# Patient Record
Sex: Female | Born: 2000 | Hispanic: No | Marital: Single | Smoking: Never smoker
Health system: Southern US, Community
[De-identification: ages and names within clinical notes are randomized; demographics above are authoritative.]

## PROBLEM LIST (undated history)

## (undated) ENCOUNTER — Inpatient Hospital Stay (HOSPITAL_COMMUNITY): Payer: Self-pay

## (undated) DIAGNOSIS — F321 Major depressive disorder, single episode, moderate: Secondary | ICD-10-CM

## (undated) DIAGNOSIS — J45909 Unspecified asthma, uncomplicated: Secondary | ICD-10-CM

## (undated) HISTORY — PX: WISDOM TOOTH EXTRACTION: SHX21

---
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
## 2022-05-27 ENCOUNTER — Encounter (HOSPITAL_COMMUNITY): Payer: Self-pay | Admitting: *Deleted

## 2022-05-27 ENCOUNTER — Ambulatory Visit (HOSPITAL_COMMUNITY)
Admission: EM | Admit: 2022-05-27 | Discharge: 2022-05-27 | Disposition: A | Discharge status: Home / Self Care | Payer: Medicaid Other | Attending: Family Medicine | Admitting: Family Medicine

## 2022-05-27 DIAGNOSIS — L03113 Cellulitis of right upper limb: Secondary | ICD-10-CM

## 2022-05-27 DIAGNOSIS — S50861A Insect bite (nonvenomous) of right forearm, initial encounter: Secondary | ICD-10-CM

## 2022-05-27 DIAGNOSIS — W57XXXA Bitten or stung by nonvenomous insect and other nonvenomous arthropods, initial encounter: Secondary | ICD-10-CM

## 2022-05-27 MED ORDER — DOXYCYCLINE HYCLATE 100 MG PO CAPS: 100.0000 mg | ORAL_CAPSULE | Freq: Two times a day (BID) | ORAL | 0 refills | Status: DC

## 2022-05-27 MED ORDER — PREDNISONE 20 MG PO TABS: 40.0000 mg | ORAL_TABLET | Freq: Every day | ORAL | 0 refills | Status: DC

## 2022-05-27 NOTE — ED Triage Notes (Signed)
Pt states she went on vacation to [place] she now she has bites all over her head and right hand. She complains they are itchy. She took a benadryl last night and today.
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
## 2022-07-17 ENCOUNTER — Other Ambulatory Visit: Payer: Self-pay

## 2022-07-17 ENCOUNTER — Encounter (HOSPITAL_COMMUNITY): Payer: Self-pay

## 2022-07-17 ENCOUNTER — Emergency Department (HOSPITAL_COMMUNITY)
Admission: EM | Admit: 2022-07-17 | Discharge: 2022-07-17 | Discharge status: Against Medical Advice | Payer: Medicaid Other | Attending: Student | Admitting: Student

## 2022-07-17 DIAGNOSIS — Z5321 Procedure and treatment not carried out due to patient leaving prior to being seen by health care provider: Secondary | ICD-10-CM | POA: Diagnosis not present

## 2022-07-17 DIAGNOSIS — H53149 Visual discomfort, unspecified: Secondary | ICD-10-CM | POA: Diagnosis not present

## 2022-07-17 DIAGNOSIS — R519 Headache, unspecified: Secondary | ICD-10-CM | POA: Insufficient documentation

## 2022-07-17 LAB — CBC WITH DIFFERENTIAL/PLATELET
Abs Immature Granulocytes: 0.06 10*3/uL (ref 0.00–0.07)
Basophils Absolute: 0 10*3/uL (ref 0.0–0.1)
Basophils Relative: 0 %
Eosinophils Absolute: 0.1 10*3/uL (ref 0.0–0.5)
Eosinophils Relative: 1 %
HCT: 43.1 % (ref 36.0–46.0)
Hemoglobin: 13.5 g/dL (ref 12.0–15.0)
Immature Granulocytes: 1 %
Lymphocytes Relative: 28 %
Lymphs Abs: 3.4 10*3/uL (ref 0.7–4.0)
MCH: 26.1 pg (ref 26.0–34.0)
MCHC: 31.3 g/dL (ref 30.0–36.0)
MCV: 83.2 fL (ref 80.0–100.0)
Monocytes Absolute: 0.6 10*3/uL (ref 0.1–1.0)
Monocytes Relative: 5 %
Neutro Abs: 7.7 10*3/uL (ref 1.7–7.7)
Neutrophils Relative %: 65 %
Platelets: 331 10*3/uL (ref 150–400)
RBC: 5.18 MIL/uL — ABNORMAL HIGH (ref 3.87–5.11)
RDW: 14.2 % (ref 11.5–15.5)
WBC: 11.9 10*3/uL — ABNORMAL HIGH (ref 4.0–10.5)
nRBC: 0 % (ref 0.0–0.2)

## 2022-07-17 LAB — BASIC METABOLIC PANEL
Anion gap: 9 (ref 5–15)
BUN: 11 mg/dL (ref 6–20)
CO2: 23 mmol/L (ref 22–32)
Calcium: 9.3 mg/dL (ref 8.9–10.3)
Chloride: 103 mmol/L (ref 98–111)
Creatinine, Ser: 0.66 mg/dL (ref 0.44–1.00)
GFR, Estimated: >60 mL/min (ref >60)
Glucose, Bld: 89 mg/dL (ref 70–99)
Potassium: 4 mmol/L (ref 3.5–5.1)
Sodium: 135 mmol/L (ref 135–145)

## 2022-07-17 LAB — I-STAT BETA HCG BLOOD, ED (MC, WL, AP ONLY): I-stat hCG, quantitative: <5 m[IU]/mL (ref <5)

## 2022-07-17 NOTE — ED Notes (Signed)
Patient transported to CT 

## 2022-07-17 NOTE — ED Provider Triage Note (Signed)
Emergency Medicine Provider Triage Evaluation Note  Lovelyn Zilles , a 22 y.o. female  was evaluated in triage.  Pt complains of severe left-sided headache which has been ongoing for 2 days.  Patient endorses history of the same, most recent occurrence 2 months ago.  Patient states that normally over-the-counter headache medicine is effective.  This time she states the medication has not been effective.  She endorses photophobia as an associated symptom  Review of Systems  Positive: As above Negative: As above  Physical Exam  BP 120/79   Pulse 83   Temp (!) 97.5 F (36.4 C) (Oral)   Resp 18   SpO2 100%  Gen:   Awake, no distress   Resp:  Normal effort  MSK:   Moves extremities without difficulty  Other:    Medical Decision Making  Medically screening exam initiated at 2:22 PM.  Appropriate orders placed.  Alishia Schroyer was informed that the remainder of the evaluation will be completed by another provider, this initial triage assessment does not replace that evaluation, and the importance of remaining in the ED until their evaluation is complete.     Dorothyann Peng, PA-C 07/17/22 1423

## 2022-07-17 NOTE — ED Notes (Signed)
Called x2 for vitals with no answer °

## 2022-07-17 NOTE — ED Triage Notes (Signed)
Pt c/o HA and photosensitivityxfew months. Pt states it got worse the last few days. Pt denies N/V

## 2023-03-28 ENCOUNTER — Emergency Department (HOSPITAL_COMMUNITY): Payer: No Typology Code available for payment source

## 2023-03-28 ENCOUNTER — Encounter (HOSPITAL_COMMUNITY): Payer: Self-pay

## 2023-03-28 ENCOUNTER — Emergency Department (HOSPITAL_COMMUNITY)
Admission: EM | Admit: 2023-03-28 | Discharge: 2023-03-28 | Disposition: A | Discharge status: Home / Self Care | Payer: No Typology Code available for payment source | Attending: Emergency Medicine | Admitting: Emergency Medicine

## 2023-03-28 ENCOUNTER — Other Ambulatory Visit: Payer: Self-pay

## 2023-03-28 DIAGNOSIS — R1030 Lower abdominal pain, unspecified: Secondary | ICD-10-CM | POA: Insufficient documentation

## 2023-03-28 DIAGNOSIS — M545 Low back pain, unspecified: Secondary | ICD-10-CM | POA: Diagnosis present

## 2023-03-28 DIAGNOSIS — Y92410 Unspecified street and highway as the place of occurrence of the external cause: Secondary | ICD-10-CM | POA: Insufficient documentation

## 2023-03-28 DIAGNOSIS — R109 Unspecified abdominal pain: Secondary | ICD-10-CM

## 2023-03-28 LAB — CBC WITH DIFFERENTIAL/PLATELET
Abs Immature Granulocytes: 0.05 10*3/uL (ref 0.00–0.07)
Basophils Absolute: 0 10*3/uL (ref 0.0–0.1)
Basophils Relative: 0 %
Eosinophils Absolute: 0 10*3/uL (ref 0.0–0.5)
Eosinophils Relative: 0 %
HCT: 40.9 % (ref 36.0–46.0)
Hemoglobin: 12.9 g/dL (ref 12.0–15.0)
Immature Granulocytes: 0 %
Lymphocytes Relative: 15 %
Lymphs Abs: 2.3 10*3/uL (ref 0.7–4.0)
MCH: 26.2 pg (ref 26.0–34.0)
MCHC: 31.5 g/dL (ref 30.0–36.0)
MCV: 83 fL (ref 80.0–100.0)
Monocytes Absolute: 0.8 10*3/uL (ref 0.1–1.0)
Monocytes Relative: 5 %
Neutro Abs: 11.8 10*3/uL — ABNORMAL HIGH (ref 1.7–7.7)
Neutrophils Relative %: 80 %
Platelets: 282 10*3/uL (ref 150–400)
RBC: 4.93 MIL/uL (ref 3.87–5.11)
RDW: 13.6 % (ref 11.5–15.5)
WBC: 15 10*3/uL — ABNORMAL HIGH (ref 4.0–10.5)
nRBC: 0 % (ref 0.0–0.2)

## 2023-03-28 LAB — BASIC METABOLIC PANEL
Anion gap: 8 (ref 5–15)
BUN: 15 mg/dL (ref 6–20)
CO2: 24 mmol/L (ref 22–32)
Calcium: 9.3 mg/dL (ref 8.9–10.3)
Chloride: 109 mmol/L (ref 98–111)
Creatinine, Ser: 0.72 mg/dL (ref 0.44–1.00)
GFR, Estimated: >60 mL/min (ref >60)
Glucose, Bld: 82 mg/dL (ref 70–99)
Potassium: 4 mmol/L (ref 3.5–5.1)
Sodium: 141 mmol/L (ref 135–145)

## 2023-03-28 LAB — HCG, SERUM, QUALITATIVE: Preg, Serum: NEGATIVE

## 2023-03-28 LAB — LIPASE, BLOOD: Lipase: 25 U/L (ref 11–51)

## 2023-03-28 MED ORDER — IBUPROFEN 400 MG PO TABS
600.0000 mg | ORAL_TABLET | Freq: Once | ORAL | Status: AC
  Administered 2023-03-28: 600 mg via ORAL
  Filled 2023-03-28: qty 1

## 2023-03-28 MED ORDER — ACETAMINOPHEN 325 MG PO TABS
975.0000 mg | ORAL_TABLET | Freq: Once | ORAL | Status: AC
  Administered 2023-03-28: 975 mg via ORAL
  Filled 2023-03-28: qty 3

## 2023-03-28 MED ORDER — IOHEXOL 350 MG/ML SOLN
65.0000 mL | Freq: Once | INTRAVENOUS | Status: AC | PRN
  Administered 2023-03-28: 65 mL via INTRAVENOUS
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
## 2023-04-28 ENCOUNTER — Encounter (HOSPITAL_COMMUNITY): Payer: Self-pay

## 2023-04-28 ENCOUNTER — Emergency Department (HOSPITAL_COMMUNITY): Payer: MEDICAID

## 2023-04-28 ENCOUNTER — Other Ambulatory Visit: Payer: Self-pay

## 2023-04-28 ENCOUNTER — Emergency Department (HOSPITAL_COMMUNITY)
Admission: EM | Admit: 2023-04-28 | Discharge: 2023-04-28 | Disposition: A | Discharge status: Home / Self Care | Payer: MEDICAID | Attending: Emergency Medicine | Admitting: Emergency Medicine

## 2023-04-28 DIAGNOSIS — M545 Low back pain, unspecified: Secondary | ICD-10-CM | POA: Insufficient documentation

## 2023-04-28 LAB — PREGNANCY, URINE: Preg Test, Ur: NEGATIVE

## 2023-04-28 MED ORDER — ACETAMINOPHEN 500 MG PO TABS
1000.0000 mg | ORAL_TABLET | Freq: Once | ORAL | Status: AC
  Administered 2023-04-28: 1000 mg via ORAL
  Filled 2023-04-28: qty 2

## 2023-04-28 MED ORDER — METHYLPREDNISOLONE 4 MG PO TBPK: ORAL_TABLET | ORAL | 0 refills | Status: DC

## 2023-04-28 MED ORDER — LIDOCAINE 5 % EX PTCH: 1.0000 | MEDICATED_PATCH | CUTANEOUS | 0 refills | Status: AC

## 2023-04-28 MED ORDER — CYCLOBENZAPRINE HCL 10 MG PO TABS
5.0000 mg | ORAL_TABLET | Freq: Once | ORAL | Status: AC
  Administered 2023-04-28: 5 mg via ORAL
  Filled 2023-04-28: qty 1

## 2023-04-28 MED ORDER — CYCLOBENZAPRINE HCL 10 MG PO TABS: 10.0000 mg | ORAL_TABLET | Freq: Two times a day (BID) | ORAL | 0 refills | Status: AC | PRN

## 2023-04-28 MED ORDER — LIDOCAINE 5 % EX PTCH
1.0000 | MEDICATED_PATCH | CUTANEOUS | Status: DC
  Administered 2023-04-28: 1 via TRANSDERMAL
  Filled 2023-04-28: qty 1

## 2023-04-28 NOTE — ED Notes (Signed)
Can we wait until stuff done?   Stacy Norfolk, DO 04/28/23 1710

## 2023-04-28 NOTE — ED Notes (Signed)
 Discharge instructions reviewed with patient. Patient questions answered and opportunity for education reviewed. Patient voices understanding of discharge instructions with no further questions. Patient ambulatory with steady gait to lobby.
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
## 2023-04-28 NOTE — Discharge Instructions (Addendum)
 Follow-up with your primary care doctor.  I prescribed you Medrol  Dosepak to help with the pain take as prescribed starting tomorrow.  I have prescribed lidocaine  patches.  I prescribed you a muscle relaxant called Flexeril .  Flexeril  is sedating so do not mix with alcohol or drugs or dangerous activities including driving.  Continue 1000 mg of Tylenol  every 6 hours as needed for pain as well.
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
## 2023-10-26 ENCOUNTER — Encounter (HOSPITAL_COMMUNITY): Payer: Self-pay

## 2023-10-26 ENCOUNTER — Emergency Department (HOSPITAL_COMMUNITY)
Admission: EM | Admit: 2023-10-26 | Discharge: 2023-10-26 | Discharge status: Against Medical Advice | Payer: MEDICAID | Attending: Emergency Medicine | Admitting: Emergency Medicine

## 2023-10-26 ENCOUNTER — Other Ambulatory Visit: Payer: Self-pay

## 2023-10-26 DIAGNOSIS — T7840XA Allergy, unspecified, initial encounter: Secondary | ICD-10-CM | POA: Diagnosis present

## 2023-10-26 DIAGNOSIS — F419 Anxiety disorder, unspecified: Secondary | ICD-10-CM | POA: Diagnosis not present

## 2023-10-26 DIAGNOSIS — Z5329 Procedure and treatment not carried out because of patient's decision for other reasons: Secondary | ICD-10-CM | POA: Diagnosis not present

## 2023-10-26 DIAGNOSIS — L500 Allergic urticaria: Secondary | ICD-10-CM | POA: Insufficient documentation

## 2023-10-26 MED ORDER — FAMOTIDINE IN NACL 20-0.9 MG/50ML-% IV SOLN
20.0000 mg | Freq: Once | INTRAVENOUS | Status: AC
  Administered 2023-10-26: 20 mg via INTRAVENOUS
  Filled 2023-10-26: qty 50

## 2023-10-26 MED ORDER — METHYLPREDNISOLONE SODIUM SUCC 125 MG IJ SOLR
125.0000 mg | Freq: Once | INTRAMUSCULAR | Status: AC
  Administered 2023-10-26: 125 mg via INTRAVENOUS
  Filled 2023-10-26: qty 2

## 2023-10-26 MED ORDER — EPINEPHRINE 0.3 MG/0.3ML IJ SOAJ
0.3000 mg | INTRAMUSCULAR | 0 refills | Status: AC | PRN
Start: 2023-10-26 — End: ?

## 2023-10-26 MED ORDER — DIPHENHYDRAMINE HCL 50 MG/ML IJ SOLN
25.0000 mg | Freq: Once | INTRAMUSCULAR | Status: AC
  Administered 2023-10-26: 25 mg via INTRAVENOUS
  Filled 2023-10-26: qty 1

## 2023-10-26 MED ORDER — EPINEPHRINE 0.3 MG/0.3ML IJ SOAJ
0.3000 mg | Freq: Once | INTRAMUSCULAR | Status: AC
  Administered 2023-10-26: 0.3 mg via INTRAMUSCULAR

## 2023-10-26 NOTE — ED Provider Notes (Signed)
 [place] EMERGENCY DEPARTMENT AT [facility] Provider Note   CSN: [id number] Arrival date & time: 10/26/23  1849     Patient presents with: Allergic Reaction   Stacy Ford is a 23 y.o. female.  With a known allergy to bees who presents after a bee sting.  Allergic reaction after bee sting to her left arm around 1800 today.  Symptoms include urticarial rash nausea vomiting shortness of breath.  No stridor.  Has an EpiPen  at home but could not find it.  Her friend brought her into the emergency department.  She is very anxious here.  History of severe allergic reactions but has never required intubation.    Allergic Reaction      Prior to Admission medications   Medication Sig Start Date End Date Taking? Authorizing Provider  EPINEPHrine  0.3 mg/0.3 mL IJ SOAJ injection Inject 0.3 mg into the muscle as needed for anaphylaxis. 10/26/23  Yes Pamella Ozell LABOR, DO  albuterol  (PROVENTIL  HFA;VENTOLIN  HFA) 108 (90 BASE) MCG/ACT inhaler Inhale 2 puffs into the lungs every 6 (six) hours as needed for wheezing or shortness of breath.  Patient not taking: Reported on 09/04/2021    [provider]  cetirizine (ZYRTEC) 10 MG tablet Take 10 mg by mouth every morning.    [provider]  chlorhexidine  (PERIDEX ) 0.12 % solution Use as directed 15 mLs in the mouth or throat 2 (two) times daily. Patient not taking: Reported on 12/13/2017 08/29/17   Mario Million, MD  cyclobenzaprine  (FLEXERIL ) 10 MG tablet Take 1 tablet (10 mg total) by mouth 2 (two) times daily as needed for muscle spasms. 04/28/23   Curatolo, Adam, DO  doxycycline  (VIBRAMYCIN ) 100 MG capsule Take 1 capsule (100 mg total) by mouth 2 (two) times daily. 05/27/22   Rolinda Rogue, MD  famotidine  (PEPCID ) 20 MG tablet Take 1 tablet (20 mg total) by mouth 2 (two) times daily. 10/23/20   Merita Delon POUR, MD  lidocaine  (LIDODERM ) 5 % Place 1 patch onto the skin daily. Remove & Discard patch within 12 hours or as  directed by MD 04/28/23   Ruthe Cornet, DO  methylPREDNISolone  (MEDROL  DOSEPAK) 4 MG TBPK tablet Follow package insert 04/28/23   Ruthe Cornet, DO  norethindrone-ethinyl estradiol 1/35 (PIRMELLA 1/35) tablet Take 1 tablet by mouth daily.    [provider]  ondansetron  (ZOFRAN  ODT) 4 MG disintegrating tablet Take 1 tablet (4 mg total) by mouth every 8 (eight) hours as needed for nausea. Patient not taking: Reported on 09/04/2021 07/15/20   Jarold Olam HERO, PA-C  predniSONE  (DELTASONE ) 20 MG tablet Take 2 tablets (40 mg total) by mouth daily. 05/27/22   Rolinda Rogue, MD  sertraline  (ZOLOFT ) 50 MG tablet Take 1 tablet (50 mg total) by mouth daily. Patient not taking: Reported on 09/04/2021 10/22/15   Saez-Benito, Eva HERO Rav, MD    Allergies: Bee venom    Review of Systems  Updated Vital Signs BP 131/72 (BP Location: Left Arm)   Pulse (!) 104   Temp 98.2 F (36.8 C) (Oral)   Resp 16   SpO2 100%   Physical Exam Vitals and nursing note reviewed.  HENT:     Head: Normocephalic and atraumatic.     Mouth/Throat:     Comments: Patent airway uvula midline no edema  Eyes:     Pupils: Pupils are equal, round, and reactive to light.    Cardiovascular:     Rate and Rhythm: Normal rate and regular rhythm.  Pulmonary:  Effort: Pulmonary effort is normal. No respiratory distress.     Breath sounds: Normal breath sounds. No stridor. No wheezing.  Abdominal:     Palpations: Abdomen is soft.     Tenderness: There is no abdominal tenderness.   Skin:    General: Skin is warm and dry.     Comments: Urticarial rash over both arms and chest.   Neurological:     Mental Status: She is alert.   Psychiatric:     Comments: Anxious     (all labs ordered are listed, but only abnormal results are displayed) Labs Reviewed - No data to display  EKG: None  Radiology: No results found.   Procedures   Medications Ordered in the ED  EPINEPHrine  (EPI-PEN) injection 0.3 mg  (0.3 mg Intramuscular Given 10/26/23 1854)  famotidine  (PEPCID ) IVPB 20 mg premix (0 mg Intravenous Stopped 10/26/23 1944)  diphenhydrAMINE  (BENADRYL ) injection 25 mg (25 mg Intravenous Given 10/26/23 1906)  methylPREDNISolone  sodium succinate (SOLU-MEDROL ) 125 mg/2 mL injection 125 mg (125 mg Intravenous Given 10/26/23 1905)                                    Medical Decision Making 23 year old female with known allergy to bee venom presents after being stung by bee.  Presentation concerning for severe allergic reaction.  Airway remains intact.  We gave epi this and she came to the ED.  Also gave Solu-Medrol  Benadryl  Pepcid .  We had intended on watching her for a period of at least 4 hours after the administration of epi however patient reported a family emergency and had to leave early prior to the fall.  Of observation.  We will call in a refill for her epi prescription  Risk Prescription drug management.        Final diagnoses:  Allergic reaction, initial encounter    ED Discharge Orders          Ordered    EPINEPHrine  0.3 mg/0.3 mL IJ SOAJ injection  As needed        10/26/23 2125               Pamella Ozell LABOR, DO 10/26/23 2125

## 2023-10-26 NOTE — ED Notes (Signed)
 Pt notified RN that she had a family emergency and need to leave at this time. RN notified pt that she was not up for discharged at this time and pt verbalized understanding and still was adamant about leaving. Per pt, she felt much better. Dr. Pamella notified. Pt visualized walking out of ED with family members without difficulty.

## 2023-10-26 NOTE — ED Triage Notes (Signed)
 Pt arrives via POV. PT reports being stung by a bee about 1 hour ago. 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 Pt c/o sob, itching, and feels like her airway is closing. Pt is AxOx4.

## 2024-04-27 ENCOUNTER — Inpatient Hospital Stay (HOSPITAL_COMMUNITY)
Admission: EM | Admit: 2024-04-27 | Discharge: 2024-04-27 | Disposition: A | Discharge status: Home / Self Care | Payer: MEDICAID | Attending: Obstetrics and Gynecology | Admitting: Obstetrics and Gynecology

## 2024-04-27 ENCOUNTER — Other Ambulatory Visit: Payer: Self-pay

## 2024-04-27 ENCOUNTER — Encounter (HOSPITAL_COMMUNITY): Payer: Self-pay | Admitting: Obstetrics and Gynecology

## 2024-04-27 DIAGNOSIS — R6889 Other general symptoms and signs: Secondary | ICD-10-CM

## 2024-04-27 DIAGNOSIS — O99281 Endocrine, nutritional and metabolic diseases complicating pregnancy, first trimester: Secondary | ICD-10-CM | POA: Diagnosis not present

## 2024-04-27 DIAGNOSIS — Z3A01 Less than 8 weeks gestation of pregnancy: Secondary | ICD-10-CM

## 2024-04-27 DIAGNOSIS — R519 Headache, unspecified: Secondary | ICD-10-CM | POA: Diagnosis not present

## 2024-04-27 DIAGNOSIS — O219 Vomiting of pregnancy, unspecified: Secondary | ICD-10-CM

## 2024-04-27 DIAGNOSIS — R0602 Shortness of breath: Secondary | ICD-10-CM

## 2024-04-27 DIAGNOSIS — R0789 Other chest pain: Secondary | ICD-10-CM

## 2024-04-27 DIAGNOSIS — O26891 Other specified pregnancy related conditions, first trimester: Secondary | ICD-10-CM | POA: Diagnosis not present

## 2024-04-27 DIAGNOSIS — R059 Cough, unspecified: Secondary | ICD-10-CM | POA: Diagnosis present

## 2024-04-27 LAB — COMPREHENSIVE METABOLIC PANEL WITH GFR
ALT: 10 U/L (ref 0–44)
AST: 21 U/L (ref 15–41)
Albumin: 3.9 g/dL (ref 3.5–5.0)
Alkaline Phosphatase: 84 U/L (ref 38–126)
Anion gap: 11 (ref 5–15)
BUN: <5 mg/dL — ABNORMAL LOW (ref 6–20)
CO2: 21 mmol/L — ABNORMAL LOW (ref 22–32)
Calcium: 9.2 mg/dL (ref 8.9–10.3)
Chloride: 102 mmol/L (ref 98–111)
Creatinine, Ser: 0.51 mg/dL (ref 0.44–1.00)
GFR, Estimated: >60 mL/min
Glucose, Bld: 94 mg/dL (ref 70–99)
Potassium: 3.8 mmol/L (ref 3.5–5.1)
Sodium: 134 mmol/L — ABNORMAL LOW (ref 135–145)
Total Bilirubin: 0.4 mg/dL (ref 0.0–1.2)
Total Protein: 7.4 g/dL (ref 6.5–8.1)

## 2024-04-27 LAB — CBC
HCT: 35.3 % — ABNORMAL LOW (ref 36.0–46.0)
Hemoglobin: 11.6 g/dL — ABNORMAL LOW (ref 12.0–15.0)
MCH: 25.5 pg — ABNORMAL LOW (ref 26.0–34.0)
MCHC: 32.9 g/dL (ref 30.0–36.0)
MCV: 77.6 fL — ABNORMAL LOW (ref 80.0–100.0)
Platelets: 243 K/uL (ref 150–400)
RBC: 4.55 MIL/uL (ref 3.87–5.11)
RDW: 13.9 % (ref 11.5–15.5)
WBC: 7.6 K/uL (ref 4.0–10.5)
nRBC: 0 % (ref 0.0–0.2)

## 2024-04-27 LAB — URINALYSIS, ROUTINE W REFLEX MICROSCOPIC
Bilirubin Urine: NEGATIVE
Glucose, UA: NEGATIVE mg/dL
Hgb urine dipstick: NEGATIVE
Ketones, ur: 5 mg/dL — AB
Nitrite: NEGATIVE
Protein, ur: NEGATIVE mg/dL
Specific Gravity, Urine: 1.016 (ref 1.005–1.030)
pH: 7 (ref 5.0–8.0)

## 2024-04-27 LAB — PRO BRAIN NATRIURETIC PEPTIDE: Pro Brain Natriuretic Peptide: 197 pg/mL

## 2024-04-27 LAB — MAGNESIUM: Magnesium: 1.5 mg/dL — ABNORMAL LOW (ref 1.7–2.4)

## 2024-04-27 LAB — TROPONIN T, HIGH SENSITIVITY: Troponin T High Sensitivity: <15 ng/L (ref 0–19)

## 2024-04-27 MED ORDER — FAMOTIDINE IN NACL 20-0.9 MG/50ML-% IV SOLN
20.0000 mg | Freq: Once | INTRAVENOUS | Status: AC
  Administered 2024-04-27: 20 mg via INTRAVENOUS
  Filled 2024-04-27: qty 50

## 2024-04-27 MED ORDER — LACTATED RINGERS IV BOLUS
1000.0000 mL | Freq: Once | INTRAVENOUS | Status: AC
  Administered 2024-04-27: 1000 mL via INTRAVENOUS

## 2024-04-27 MED ORDER — ONDANSETRON HCL 4 MG PO TABS: 8.0000 mg | ORAL_TABLET | Freq: Two times a day (BID) | ORAL | 0 refills | Status: AC

## 2024-04-27 MED ORDER — DIPHENHYDRAMINE HCL 50 MG/ML IJ SOLN
25.0000 mg | Freq: Once | INTRAMUSCULAR | Status: AC
  Administered 2024-04-27: 25 mg via INTRAVENOUS
  Filled 2024-04-27: qty 1

## 2024-04-27 MED ORDER — METOCLOPRAMIDE HCL 10 MG PO TABS: 10.0000 mg | ORAL_TABLET | Freq: Four times a day (QID) | ORAL | 0 refills | Status: AC

## 2024-04-27 MED ORDER — MAGNESIUM 30 MG PO TABS: 30.0000 mg | ORAL_TABLET | Freq: Two times a day (BID) | ORAL | 0 refills | Status: AC

## 2024-04-27 MED ORDER — OSELTAMIVIR PHOSPHATE 75 MG PO CAPS: 75.0000 mg | ORAL_CAPSULE | Freq: Two times a day (BID) | ORAL | 0 refills | Status: AC

## 2024-04-27 MED ORDER — ONDANSETRON HCL 4 MG/2ML IJ SOLN
4.0000 mg | Freq: Once | INTRAMUSCULAR | Status: AC
  Administered 2024-04-27: 4 mg via INTRAVENOUS
  Filled 2024-04-27: qty 2

## 2024-04-27 MED ORDER — SCOPOLAMINE 1 MG/3DAYS TD PT72: 1.0000 | MEDICATED_PATCH | TRANSDERMAL | 0 refills | Status: AC

## 2024-04-27 NOTE — MAU Provider Note (Signed)
 Chief Complaint:  Shortness of Breath, Oral Swelling, Nausea, and Emesis   HPI   Stacy Ford is a 23 y.o. G2P0010 at [redacted]w[redacted]d who presents to maternity admissions reporting she was sent from the ED with complaints of nausea and vomiting that began this morning.  Patient reports she has vomited over 20 times today.  She also reports that she does not have any antiemetics at home.    She reports that she has known flu contact with brother but denies fever, chills.  Patient also reports that she started having some SOB with some chest pain after the vomiting started yesterday .  She offers no OB C/O at this time . Denies any vaginal bleeding, leaking of fluid, abdominal cramping.  Pregnancy Course: [facility]   Past Medical History:  Diagnosis Date   Asthma    MDD (major depressive disorder), single episode, moderate (HCC) 10/22/2015   Miscarriage 2021   OB History  Gravida Para Term Preterm AB Living  2    1   SAB IAB Ectopic Multiple Live Births  1        # Outcome Date GA Lbr Len/2nd Weight Sex Type Anes PTL Lv  2 Current           1 SAB 2021           Past Surgical History:  Procedure Laterality Date   WISDOM TOOTH EXTRACTION Bilateral    Family History  Problem Relation Age of Onset   Heart disease Mother    Social History[1] Allergies[2] Medications Prior to Admission  Medication Sig Dispense Refill Last Dose/Taking   Prenatal Vit-Fe Fumarate-FA (MULTIVITAMIN-PRENATAL) 27-0.8 MG TABS tablet Take 1 tablet by mouth daily at 12 noon.   04/26/2024   albuterol  (PROVENTIL  HFA;VENTOLIN  HFA) 108 (90 BASE) MCG/ACT inhaler Inhale 2 puffs into the lungs every 6 (six) hours as needed for wheezing or shortness of breath.  (Patient not taking: Reported on 09/04/2021)      cetirizine (ZYRTEC) 10 MG tablet Take 10 mg by mouth every morning.      chlorhexidine  (PERIDEX ) 0.12 % solution Use as directed 15 mLs in the mouth or throat 2 (two) times daily. (Patient not taking: Reported on  12/13/2017) 120 mL 0    cyclobenzaprine  (FLEXERIL ) 10 MG tablet Take 1 tablet (10 mg total) by mouth 2 (two) times daily as needed for muscle spasms. 20 tablet 0    doxycycline  (VIBRAMYCIN ) 100 MG capsule Take 1 capsule (100 mg total) by mouth 2 (two) times daily. 14 capsule 0    EPINEPHrine  0.3 mg/0.3 mL IJ SOAJ injection Inject 0.3 mg into the muscle as needed for anaphylaxis. 1 each 0    famotidine  (PEPCID ) 20 MG tablet Take 1 tablet (20 mg total) by mouth 2 (two) times daily. 30 tablet 0    lidocaine  (LIDODERM ) 5 % Place 1 patch onto the skin daily. Remove & Discard patch within 12 hours or as directed by MD 30 patch 0    methylPREDNISolone  (MEDROL  DOSEPAK) 4 MG TBPK tablet Follow package insert 21 each 0    norethindrone-ethinyl estradiol 1/35 (PIRMELLA 1/35) tablet Take 1 tablet by mouth daily.      ondansetron  (ZOFRAN  ODT) 4 MG disintegrating tablet Take 1 tablet (4 mg total) by mouth every 8 (eight) hours as needed for nausea. (Patient not taking: Reported on 09/04/2021) 10 tablet 0    predniSONE  (DELTASONE ) 20 MG tablet Take 2 tablets (40 mg total) by mouth daily. 10 tablet 0  sertraline  (ZOLOFT ) 50 MG tablet Take 1 tablet (50 mg total) by mouth daily. (Patient not taking: Reported on 09/04/2021) 30 tablet 0     I have reviewed patient's Past Medical Hx, Surgical Hx, Family Hx, Social Hx, medications and allergies.   ROS  Pertinent items noted in HPI and remainder of comprehensive ROS otherwise negative.   PHYSICAL EXAM  Patient Vitals for the past 24 hrs:  BP Temp Temp src Pulse Resp SpO2 Height Weight  04/27/24 1231 (!) 136/94 -- Oral 95 20 100 % -- --  04/27/24 1223 -- -- -- -- -- -- 5' 4 (1.626 m) 97.2 kg  04/27/24 1044 129/74 98.2 F (36.8 C) -- 98 18 100 % -- --  Physical Exam Vitals and nursing note reviewed.  Constitutional:      General: She is not in acute distress.    Appearance: She is well-developed. She is obese. She is ill-appearing.  HENT:     Head:  Normocephalic.     Nose: Congestion present.     Mouth/Throat:     Mouth: Mucous membranes are dry.  Cardiovascular:     Rate and Rhythm: Normal rate.  Pulmonary:     Effort: Pulmonary effort is normal.  Abdominal:     Palpations: Abdomen is soft.     Tenderness: There is no abdominal tenderness.  Musculoskeletal:        General: Normal range of motion.     Cervical back: Normal range of motion.  Skin:    General: Skin is warm.     Coloration: Skin is pale.  Neurological:     Mental Status: She is oriented to person, place, and time. She is lethargic.  Psychiatric:        Behavior: Behavior normal.     Labs: Results for orders placed or performed during the hospital encounter of 04/27/24 (from the past 24 hours)  Urinalysis, Routine w reflex microscopic -Urine, Clean Catch     Status: Abnormal   Collection Time: 04/27/24 12:10 PM  Result Value Ref Range   Color, Urine YELLOW YELLOW   APPearance HAZY (A) CLEAR   Specific Gravity, Urine 1.016 1.005 - 1.030   pH 7.0 5.0 - 8.0   Glucose, UA NEGATIVE NEGATIVE mg/dL   Hgb urine dipstick NEGATIVE NEGATIVE   Bilirubin Urine NEGATIVE NEGATIVE   Ketones, ur 5 (A) NEGATIVE mg/dL   Protein, ur NEGATIVE NEGATIVE mg/dL   Nitrite NEGATIVE NEGATIVE   Leukocytes,Ua TRACE (A) NEGATIVE   RBC / HPF 0-5 0 - 5 RBC/hpf   WBC, UA 0-5 0 - 5 WBC/hpf   Bacteria, UA RARE (A) NONE SEEN   Squamous Epithelial / HPF 0-5 0 - 5 /HPF   Mucus PRESENT   CBC     Status: Abnormal   Collection Time: 04/27/24  1:41 PM  Result Value Ref Range   WBC 7.6 4.0 - 10.5 K/uL   RBC 4.55 3.87 - 5.11 MIL/uL   Hemoglobin 11.6 (L) 12.0 - 15.0 g/dL   HCT 64.6 (L) 63.9 - 53.9 %   MCV 77.6 (L) 80.0 - 100.0 fL   MCH 25.5 (L) 26.0 - 34.0 pg   MCHC 32.9 30.0 - 36.0 g/dL   RDW 86.0 88.4 - 84.4 %   Platelets 243 150 - 400 K/uL   nRBC 0.0 0.0 - 0.2 %  Comprehensive metabolic panel     Status: Abnormal   Collection Time: 04/27/24  1:41 PM  Result Value Ref Range  Sodium 134 (L) 135 - 145 mmol/L   Potassium 3.8 3.5 - 5.1 mmol/L   Chloride 102 98 - 111 mmol/L   CO2 21 (L) 22 - 32 mmol/L   Glucose, Bld 94 70 - 99 mg/dL   BUN <5 (L) 6 - 20 mg/dL   Creatinine, Ser 9.48 0.44 - 1.00 mg/dL   Calcium 9.2 8.9 - 89.6 mg/dL   Total Protein 7.4 6.5 - 8.1 g/dL   Albumin 3.9 3.5 - 5.0 g/dL   AST 21 15 - 41 U/L   ALT 10 0 - 44 U/L   Alkaline Phosphatase 84 38 - 126 U/L   Total Bilirubin 0.4 0.0 - 1.2 mg/dL   GFR, Estimated >39 >39 mL/min   Anion gap 11 5 - 15  Pro Brain natriuretic peptide     Status: None   Collection Time: 04/27/24  1:41 PM  Result Value Ref Range   Pro Brain Natriuretic Peptide 197.0 <300.0 pg/mL  Troponin T, High Sensitivity     Status: None   Collection Time: 04/27/24  1:41 PM  Result Value Ref Range   Troponin T High Sensitivity <15 0 - 19 ng/L  Magnesium     Status: Abnormal   Collection Time: 04/27/24  1:41 PM  Result Value Ref Range   Magnesium 1.5 (L) 1.7 - 2.4 mg/dL    Imaging:  No results found.  MDM & MAU COURSE  MDM:  HIGH   N/V in pregnancy/influenza exposure  CBC: Hgb 11.6 CMP: NM Serum magnesium : 1.5 ( Hypomagnesemia) Will plan for RX at discharge  UA: No Evidence of UTI IVF Bolus for N/V and likely dehydration Antiemetics: Zofran  IVP PO Challenge : Tolerated EKG for c/o CP and SOB Troponin's negative BNP negative Benadryl  IVP for c/o HA Plan for DC home on antiemetics and Tamiflu due to known exposure to influenza  Patient reassessed at 1500 by RN and was found to be feeling better . Will plan for discharge at this time  Based on the patient's presentation with flulike symptoms and known exposure within the last 24 hours to her brother who is tested positive for the flu per new protocol recommendations are for treatment without need for testing with Tamiflu. S/R.B d/w patient and she agrees with management    Differential diagnosis with nausea/vomiting includes but is not limited to: nausea due to  pregnancy, hyperemesis gravidarum, viral infection, gastroenteritis, cholecystitis, pancreatitis, ACS, food poisoning, diabetic ketoacidosis, drug ingestion, gastroparesis, alcohol/opiate withdrawal   MAU Course: Orders Placed This Encounter  Procedures   Urinalysis, Routine w reflex microscopic -Urine, Clean Catch   CBC   Comprehensive metabolic panel   Pro Brain natriuretic peptide   Magnesium   EKG 12-Lead   Discharge patient Discharge disposition: 01-Home or Self Care; Discharge patient date: 04/27/2024   Meds ordered this encounter  Medications   lactated ringers bolus 1,000 mL   ondansetron  (ZOFRAN ) injection 4 mg   famotidine  (PEPCID ) IVPB 20 mg premix   diphenhydrAMINE  (BENADRYL ) injection 25 mg   ondansetron  (ZOFRAN ) 4 MG tablet    Sig: Take 2 tablets (8 mg total) by mouth 2 (two) times daily.    Dispense:  20 tablet    Refill:  0    Supervising Provider:   PRATT, TANYA S [[id number]]   metoCLOPramide (REGLAN) 10 MG tablet    Sig: Take 1 tablet (10 mg total) by mouth every 6 (six) hours.    Dispense:  30 tablet    Refill:  0    Supervising Provider:   PRATT, TANYA S [[id number]]   scopolamine (TRANSDERM-SCOP) 1 MG/3DAYS    Sig: Place 1 patch (1 mg total) onto the skin every 3 (three) days.    Dispense:  10 patch    Refill:  0    Supervising Provider:   PRATT, TANYA S [[id number]]   magnesium 30 MG tablet    Sig: Take 1 tablet (30 mg total) by mouth 2 (two) times daily.    Dispense:  60 tablet    Refill:  0    Supervising Provider:   PRATT, TANYA S [[id number]]   oseltamivir (TAMIFLU) 75 MG capsule    Sig: Take 1 capsule (75 mg total) by mouth every 12 (twelve) hours.    Dispense:  10 capsule    Refill:  0    Supervising Provider:   PRATT, TANYA S [[id number]]    I have reviewed the patient chart and performed the physical exam . I have ordered & interpreted the lab results and reviewed and interpreted them  Medications ordered as stated above/ below.  A/P as described below.  Counseling and  education provided.  Please see AVS for detailed education and counseling provided both written and verbally.  The patient is  agreeable  with plan as described below. Verbalized understanding.    ASSESSMENT   1. Nausea and vomiting during pregnancy   2. Flu-like symptoms   3. [redacted] weeks gestation of pregnancy   4. Acute nonintractable headache, unspecified headache type   5. SOB (shortness of breath)   6. Chest tightness   7. Hypomagnesemia     PLAN  Discharge home in stable condition with return precautions.   See AVS for full description of information given to the patient including both verbal and written. Patient verbalized understanding and agrees with the plan as described above.      Allergies as of 04/27/2024       Reactions   Bee Venom Anaphylaxis        Medication List     STOP taking these medications    doxycycline  100 MG capsule Commonly known as: VIBRAMYCIN    methylPREDNISolone  4 MG Tbpk tablet Commonly known as: MEDROL  DOSEPAK   Pirmella 1/35 tablet Generic drug: norethindrone-ethinyl estradiol 1/35   predniSONE  20 MG tablet Commonly known as: DELTASONE        TAKE these medications    albuterol  108 (90 Base) MCG/ACT inhaler Commonly known as: VENTOLIN  HFA Inhale 2 puffs into the lungs every 6 (six) hours as needed for wheezing or shortness of breath.   cetirizine 10 MG tablet Commonly known as: ZYRTEC Take 10 mg by mouth every morning.   chlorhexidine  0.12 % solution Commonly known as: PERIDEX  Use as directed 15 mLs in the mouth or throat 2 (two) times daily.   cyclobenzaprine  10 MG tablet Commonly known as: FLEXERIL  Take 1 tablet (10 mg total) by mouth 2 (two) times daily as needed for muscle spasms.   EPINEPHrine  0.3 mg/0.3 mL Soaj injection Commonly known as: EPI-PEN Inject 0.3 mg into the muscle as needed for anaphylaxis.   famotidine  20 MG tablet Commonly known as: PEPCID  Take 1 tablet (20 mg total) by mouth 2 (two) times  daily.   lidocaine  5 % Commonly known as: Lidoderm  Place 1 patch onto the skin daily. Remove & Discard patch within 12 hours or as directed by MD   magnesium 30 MG tablet Take 1 tablet (30 mg total) by mouth 2 (two) times daily.  metoCLOPramide 10 MG tablet Commonly known as: REGLAN Take 1 tablet (10 mg total) by mouth every 6 (six) hours.   multivitamin-prenatal 27-0.8 MG Tabs tablet Take 1 tablet by mouth daily at 12 noon.   ondansetron  4 MG disintegrating tablet Commonly known as: Zofran  ODT Take 1 tablet (4 mg total) by mouth every 8 (eight) hours as needed for nausea.   ondansetron  4 MG tablet Commonly known as: Zofran  Take 2 tablets (8 mg total) by mouth 2 (two) times daily.   oseltamivir 75 MG capsule Commonly known as: TAMIFLU Take 1 capsule (75 mg total) by mouth every 12 (twelve) hours.   scopolamine 1 MG/3DAYS Commonly known as: TRANSDERM-SCOP Place 1 patch (1 mg total) onto the skin every 3 (three) days.   sertraline  50 MG tablet Commonly known as: ZOLOFT  Take 1 tablet (50 mg total) by mouth daily.        Olam Dalton, MSN, WHNP-BC [facility] [employer]        [1]  Social History Tobacco Use   Smoking status: Never    Passive exposure: Yes   Smokeless tobacco: Never  Vaping Use   Vaping status: Former  Substance Use Topics   Alcohol use: No   Drug use: Not Currently    Types: Marijuana  [2]  Allergies Allergen Reactions   Bee Venom Anaphylaxis

## 2024-04-27 NOTE — MAU Note (Signed)
 Stacy Ford is a 23 y.o. at Unknown here in MAU reporting: she's having N/V that began this morning, states has vomited over 20x today.  States doesn't have any prescribed meds, MD sent Rx today.  Also reports began having SOB and chest pain today, since the vomiting started.  States feels like elephant sitting on chest. Denies VB and abdominal pain. Reports been close contact with brother, he has the flu.  LMP: 03/05/2024 Onset of complaint: today Pain score: 8 Vitals:   04/27/24 1044 04/27/24 1231  BP: 129/74 (!) 136/94  Pulse: 98 95  Resp: 18 20  Temp: 98.2 F (36.8 C)   SpO2: 100% 100%     FHT: NA  Lab orders placed from triage: UA

## 2024-04-27 NOTE — ED Triage Notes (Signed)
 Pt is here vomiting that started last night and today feels like throat is tighting and tight in chest. Patient reports she is [redacted]weeks pregnant. Pt is talking in full sentences

## 2024-04-27 NOTE — ED Provider Triage Note (Signed)
 Emergency Medicine Provider OB Triage Evaluation Note  Stacy Ford is a 23 y.o. female, G1P0010, at Unknown gestation who presents to the emergency department with complaints of vomiting, cough and pregnancy  Review of  Systems  Positive: cough vomiting Negative: fever  Physical Exam  BP 129/74   Pulse 98   Temp 98.2 F (36.8 C)   Resp 18   SpO2 100%  General: Awake, no distress  HEENT: Atraumatic  Resp: Normal effort  Cardiac: Normal rate Abd: Nondistended, nontender  MSK: Moves all extremities without difficulty Neuro: Speech clear  Medical Decision Making  Pt evaluated for pregnancy concern and is stable for transfer to MAU. Pt is in agreement with plan for transfer.  11:13 AM Discussed with MAU APP, who accepts patient in transfer.  Clinical Impression  No diagnosis found.     Flint Sonny POUR, PA-C 04/27/24 1113
